# Patient Record
Sex: Female | Born: 2005 | Race: Black or African American | Hispanic: No | Marital: Single
Health system: Southern US, Community
[De-identification: ages and names within clinical notes are randomized; demographics above are authoritative.]

## PROBLEM LIST (undated history)

## (undated) ENCOUNTER — Ambulatory Visit: Admission: EM | Source: Home / Self Care

## (undated) DIAGNOSIS — J302 Other seasonal allergic rhinitis: Secondary | ICD-10-CM

---
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
## 2024-01-23 DIAGNOSIS — M791 Myalgia, unspecified site: Secondary | ICD-10-CM | POA: Diagnosis not present

## 2024-01-23 DIAGNOSIS — Z5321 Procedure and treatment not carried out due to patient leaving prior to being seen by health care provider: Secondary | ICD-10-CM | POA: Insufficient documentation

## 2024-01-23 DIAGNOSIS — R059 Cough, unspecified: Secondary | ICD-10-CM | POA: Diagnosis present

## 2024-01-23 DIAGNOSIS — R509 Fever, unspecified: Secondary | ICD-10-CM | POA: Diagnosis not present

## 2024-01-24 ENCOUNTER — Other Ambulatory Visit: Payer: Self-pay

## 2024-01-24 ENCOUNTER — Emergency Department (HOSPITAL_COMMUNITY)

## 2024-01-24 ENCOUNTER — Emergency Department (HOSPITAL_COMMUNITY)
Admission: EM | Admit: 2024-01-24 | Discharge: 2024-01-24 | Attending: Emergency Medicine | Admitting: Emergency Medicine

## 2024-01-24 ENCOUNTER — Encounter (HOSPITAL_COMMUNITY): Payer: Self-pay | Admitting: *Deleted

## 2024-01-24 LAB — COMPREHENSIVE METABOLIC PANEL WITH GFR
ALT: 10 U/L (ref 0–44)
AST: 17 U/L (ref 15–41)
Albumin: 4.4 g/dL (ref 3.5–5.0)
Alkaline Phosphatase: 52 U/L (ref 38–126)
Anion gap: 11 (ref 5–15)
BUN: 9 mg/dL (ref 6–20)
CO2: 21 mmol/L — ABNORMAL LOW (ref 22–32)
Calcium: 9.2 mg/dL (ref 8.9–10.3)
Chloride: 104 mmol/L (ref 98–111)
Creatinine, Ser: 0.55 mg/dL (ref 0.44–1.00)
GFR, Estimated: >60 mL/min
Glucose, Bld: 101 mg/dL — ABNORMAL HIGH (ref 70–99)
Potassium: 3.4 mmol/L — ABNORMAL LOW (ref 3.5–5.1)
Sodium: 136 mmol/L (ref 135–145)
Total Bilirubin: 0.2 mg/dL (ref 0.0–1.2)
Total Protein: 7.3 g/dL (ref 6.5–8.1)

## 2024-01-24 LAB — URINALYSIS, ROUTINE W REFLEX MICROSCOPIC
Bilirubin Urine: NEGATIVE
Glucose, UA: NEGATIVE mg/dL
Hgb urine dipstick: NEGATIVE
Ketones, ur: 15 mg/dL — AB
Nitrite: POSITIVE — AB
Protein, ur: NEGATIVE mg/dL
Specific Gravity, Urine: 1.015 (ref 1.005–1.030)
pH: 8.5 — ABNORMAL HIGH (ref 5.0–8.0)

## 2024-01-24 LAB — CBC WITH DIFFERENTIAL/PLATELET
Abs Immature Granulocytes: 0.02 K/uL (ref 0.00–0.07)
Basophils Absolute: 0 K/uL (ref 0.0–0.1)
Basophils Relative: 0 %
Eosinophils Absolute: 0 K/uL (ref 0.0–0.5)
Eosinophils Relative: 0 %
HCT: 27.4 % — ABNORMAL LOW (ref 36.0–46.0)
Hemoglobin: 8.3 g/dL — ABNORMAL LOW (ref 12.0–15.0)
Immature Granulocytes: 0 %
Lymphocytes Relative: 9 %
Lymphs Abs: 0.5 K/uL — ABNORMAL LOW (ref 0.7–4.0)
MCH: 20.8 pg — ABNORMAL LOW (ref 26.0–34.0)
MCHC: 30.3 g/dL (ref 30.0–36.0)
MCV: 68.5 fL — ABNORMAL LOW (ref 80.0–100.0)
Monocytes Absolute: 0.5 K/uL (ref 0.1–1.0)
Monocytes Relative: 9 %
Neutro Abs: 4.6 K/uL (ref 1.7–7.7)
Neutrophils Relative %: 82 %
Platelets: 284 K/uL (ref 150–400)
RBC: 4 MIL/uL (ref 3.87–5.11)
RDW: 17.9 % — ABNORMAL HIGH (ref 11.5–15.5)
WBC: 5.6 K/uL (ref 4.0–10.5)
nRBC: 0 % (ref 0.0–0.2)

## 2024-01-24 LAB — URINALYSIS, MICROSCOPIC (REFLEX)

## 2024-01-24 LAB — RESP PANEL BY RT-PCR (RSV, FLU A&B, COVID)  RVPGX2
Influenza A by PCR: NEGATIVE
Influenza B by PCR: NEGATIVE
Resp Syncytial Virus by PCR: NEGATIVE
SARS Coronavirus 2 by RT PCR: NEGATIVE

## 2024-01-24 MED ORDER — ACETAMINOPHEN 325 MG PO TABS
650.0000 mg | ORAL_TABLET | Freq: Once | ORAL | Status: AC
  Administered 2024-01-24: 650 mg via ORAL

## 2024-01-24 MED ORDER — ACETAMINOPHEN 325 MG PO TABS
ORAL_TABLET | ORAL | Status: AC
  Filled 2024-01-24: qty 2

## 2024-01-24 NOTE — ED Notes (Signed)
 Pt left AMA.  KM

## 2024-01-24 NOTE — ED Triage Notes (Signed)
 The pt is c/o body aches elevated temp  cough for 3-4 days she thinks she has the flu  lmp  December 14th

## 2024-01-25 ENCOUNTER — Encounter (HOSPITAL_COMMUNITY): Payer: Self-pay

## 2024-01-25 ENCOUNTER — Ambulatory Visit (HOSPITAL_COMMUNITY)
Admission: EM | Admit: 2024-01-25 | Discharge: 2024-01-25 | Disposition: A | Discharge status: Home / Self Care | Attending: Neurology | Admitting: Neurology

## 2024-01-25 DIAGNOSIS — J09X2 Influenza due to identified novel influenza A virus with other respiratory manifestations: Secondary | ICD-10-CM | POA: Diagnosis not present

## 2024-01-25 LAB — POC COVID19/FLU A&B COMBO
Covid Antigen, POC: NEGATIVE
Influenza A Antigen, POC: POSITIVE — AB
Influenza B Antigen, POC: NEGATIVE

## 2024-01-25 MED ORDER — ALBUTEROL SULFATE HFA 108 (90 BASE) MCG/ACT IN AERS
2.0000 | INHALATION_SPRAY | Freq: Once | RESPIRATORY_TRACT | Status: AC
  Administered 2024-01-25: 2 via RESPIRATORY_TRACT

## 2024-01-25 MED ORDER — CETIRIZINE HCL 10 MG PO TABS: 10.0000 mg | ORAL_TABLET | Freq: Every day | ORAL | 0 refills | Status: AC

## 2024-01-25 MED ORDER — ACETAMINOPHEN 325 MG PO TABS
650.0000 mg | ORAL_TABLET | Freq: Once | ORAL | Status: AC
  Administered 2024-01-25: 650 mg via ORAL

## 2024-01-25 MED ORDER — ALBUTEROL SULFATE HFA 108 (90 BASE) MCG/ACT IN AERS
INHALATION_SPRAY | RESPIRATORY_TRACT | Status: AC
  Filled 2024-01-25: qty 6.7

## 2024-01-25 MED ORDER — ACETAMINOPHEN 500 MG PO TABS: 500.0000 mg | ORAL_TABLET | Freq: Four times a day (QID) | ORAL | 0 refills | Status: AC | PRN

## 2024-01-25 MED ORDER — OSELTAMIVIR PHOSPHATE 75 MG PO CAPS: 75.0000 mg | ORAL_CAPSULE | Freq: Two times a day (BID) | ORAL | 0 refills | Status: AC

## 2024-01-25 MED ORDER — ACETAMINOPHEN 325 MG PO TABS
ORAL_TABLET | ORAL | Status: AC
  Filled 2024-01-25: qty 2

## 2024-01-25 MED ORDER — PROMETHAZINE-DM 6.25-15 MG/5ML PO SYRP: 5.0000 mL | ORAL_SOLUTION | Freq: Every evening | ORAL | 0 refills | Status: AC | PRN

## 2024-01-25 MED ORDER — AEROCHAMBER PLUS FLO-VU MEDIUM MISC: 1.0000 | Freq: Once | Status: DC

## 2024-01-25 NOTE — ED Provider Notes (Signed)
 " [facility]    CSN: [id number] Arrival date & time: 01/25/24  1241      History   Chief Complaint Chief Complaint  Patient presents with   Cough    HPI Cheryl Hughes is a 18 y.o. female.   Patient presents with fever, congestion, chest tightness, cough, and sore throat that started on Friday.  She tested negative for the flu yesterday in the ER and then left prior to getting medication. Her symptoms initially started at work, unsure if she has had any sick contacts. Her mom does think that she might have asthma but she has not been formally tested for it mom is in agreement call her PCP to get her tested once she recovers from her illness.  Patient endorses feeling slightly short of breath at rest right.  The history is provided by the patient.  Cough   Past Medical History:  Diagnosis Date   Seasonal allergies     There are no active problems to display for this patient.   History reviewed. No pertinent surgical history.  OB History   No obstetric history on file.      Home Medications    Prior to Admission medications  Not on File    Family History Family History  Problem Relation Age of Onset   Healthy Mother    Healthy Father     Social History Social History[1]   Allergies   Patient has no known allergies.   Review of Systems Review of Systems  Respiratory:  Positive for cough.      Physical Exam Triage Vital Signs ED Triage Vitals [01/25/24 1331]  Encounter Vitals Group     BP 107/68     Girls Systolic BP Percentile      Girls Diastolic BP Percentile      Boys Systolic BP Percentile      Boys Diastolic BP Percentile      Pulse Rate (!) 120     Resp 17     Temp (!) 100.5 F (38.1 C)     Temp Source Oral     SpO2 98 %     Weight      Height      Head Circumference      Peak Flow      Pain Score      Pain Loc      Pain Education      Exclude from Growth Chart    No data found.  Updated Vital Signs BP 107/68  (BP Location: Right Arm)   Pulse (!) 120   Temp (!) 100.5 F (38.1 C) (Oral)   Resp 17   LMP 01/17/2024   SpO2 98%   Visual Acuity Right Eye Distance:   Left Eye Distance:   Bilateral Distance:    Right Eye Near:   Left Eye Near:    Bilateral Near:     Physical Exam Vitals and nursing note reviewed.  Constitutional:      General: She is not in acute distress.    Appearance: She is well-developed.  HENT:     Head: Normocephalic and atraumatic.  Eyes:     Conjunctiva/sclera: Conjunctivae normal.  Cardiovascular:     Rate and Rhythm: Normal rate and regular rhythm.     Heart sounds: No murmur heard. Pulmonary:     Effort: Pulmonary effort is normal. No respiratory distress.     Breath sounds: Decreased air movement present.     Comments: No wheezes  or coarse breath sounds Abdominal:     Palpations: Abdomen is soft.     Tenderness: There is no abdominal tenderness.  Musculoskeletal:        General: No swelling.     Cervical back: Neck supple.  Skin:    General: Skin is warm and dry.     Capillary Refill: Capillary refill takes less than 2 seconds.  Neurological:     Mental Status: She is alert.  Psychiatric:        Mood and Affect: Mood normal.      UC Treatments / Results  Labs (all labs ordered are listed, but only abnormal results are displayed) Labs Reviewed  POC COVID19/FLU A&B COMBO    EKG   Radiology DG Chest 2 View Result Date: 01/24/2024 CLINICAL DATA:  Cough for several days EXAM: CHEST - 2 VIEW COMPARISON:  10/09/2017 FINDINGS: The heart size and mediastinal contours are within normal limits. Both lungs are clear. The visualized skeletal structures are unremarkable. IMPRESSION: No active cardiopulmonary disease. Electronically Signed   By: Oneil Devonshire M.D.   On: 01/24/2024 00:57    Procedures Procedures (including critical care time)  Medications Ordered in UC Medications  acetaminophen  (TYLENOL ) tablet 650 mg (650 mg Oral Given 01/25/24  1339)    Initial Impression / Assessment and Plan / UC Course  I have reviewed the triage vital signs and the nursing notes.  Pertinent labs & imaging results that were available during my care of the patient were reviewed by me and considered in my medical decision making (see chart for details).  Flu A point of care testing positive. Lungs clear, vitals hemodynamically stable, therefore deferred imaging. Interventions in clinic: tylenol , albuterol   Offered antiviral given timing of illness, Tamiflu  sent to pharmacy.  Recommend supportive care for further symptomatic relief as outlined in AVS.  Modes of transmission, quarantine recommendations, and hand hygiene discussed.   Final Clinical Impressions(s) / UC Diagnoses   Final diagnoses:  None     Discharge Instructions      You have a viral illness which will improve on its own with rest, fluids, and medications to help with your symptoms. Tylenol , guaifenesin (plain mucinex), and saline nasal sprays may help relieve symptoms.  Two teaspoons of honey in 1 cup of warm water every 4-6 hours may help with throat pains. Humidifier in room at nighttime may help soothe cough (clean well daily).   Take Promethazine  DM cough medication to help with your cough at nighttime so that you are able to sleep. Do not drive, drink alcohol, or go to work while taking this medication since it can make you sleepy. Only take this at nighttime.   For chest pain, shortness of breath, inability to keep food or fluids down without vomiting, fever that does not respond to tylenol  or motrin , or any other severe symptoms, please go to the ER for further evaluation. Return to urgent care as needed, otherwise follow-up with PCP.      ED Prescriptions   None    PDMP not reviewed this encounter.     [1]  Social History Tobacco Use   Smoking status: Passive Smoke Exposure - Never Smoker   Smokeless tobacco: Never   Tobacco comments:    father  smokes  Vaping Use   Vaping status: Never Used  Substance Use Topics   Alcohol use: No   Drug use: No     Remi Pippin, NP 01/25/24 1410  "

## 2024-01-25 NOTE — ED Triage Notes (Addendum)
 Pt has c/o running fever, chest tightness, congestion, cough and sore throat that started Friday. Pt was previously seen in the ER for symptoms and tested negative for Flu. Pt has taken mucinex on Friday

## 2024-01-25 NOTE — Discharge Instructions (Addendum)
 Please follow up with your primary care provider for further asthma evaluation. We have provided your with an inhaler to use for your symptoms.  You have tested positive for Flu A Take Tamiflu  every 12 hours for the next 5 days to improve symptoms and stop the virus from replicating in your body. Ibuprofen /tylenol  as needed for fevers and body aches. Mucinex as needed for nasal congestion. Zofran as needed for nausea/vomiting
